# Patient Record
Sex: Female | Born: 1995
Health system: Southern US, Community
[De-identification: ages and names within clinical notes are randomized; demographics above are authoritative.]

## PROBLEM LIST (undated history)

## (undated) DIAGNOSIS — Z789 Other specified health status: Secondary | ICD-10-CM

## (undated) HISTORY — PX: NO PAST SURGERIES: SHX2092

## (undated) HISTORY — DX: Other specified health status: Z78.9

---
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
## 2018-07-07 ENCOUNTER — Emergency Department: Payer: BLUE CROSS/BLUE SHIELD

## 2018-07-07 ENCOUNTER — Encounter: Payer: Self-pay | Admitting: Emergency Medicine

## 2018-07-07 ENCOUNTER — Other Ambulatory Visit: Payer: Self-pay

## 2018-07-07 ENCOUNTER — Emergency Department
Admission: EM | Admit: 2018-07-07 | Discharge: 2018-07-07 | Disposition: A | Discharge status: Home / Self Care | Payer: BLUE CROSS/BLUE SHIELD | Attending: Emergency Medicine | Admitting: Emergency Medicine

## 2018-07-07 DIAGNOSIS — R0602 Shortness of breath: Secondary | ICD-10-CM | POA: Diagnosis not present

## 2018-07-07 DIAGNOSIS — J40 Bronchitis, not specified as acute or chronic: Secondary | ICD-10-CM | POA: Insufficient documentation

## 2018-07-07 DIAGNOSIS — R05 Cough: Secondary | ICD-10-CM | POA: Diagnosis not present

## 2018-07-07 DIAGNOSIS — R079 Chest pain, unspecified: Secondary | ICD-10-CM | POA: Diagnosis not present

## 2018-07-07 DIAGNOSIS — J0110 Acute frontal sinusitis, unspecified: Secondary | ICD-10-CM | POA: Insufficient documentation

## 2018-07-07 LAB — TROPONIN I
Troponin I: <0.03 ng/mL (ref <0.03)
Troponin I: <0.03 ng/mL (ref <0.03)

## 2018-07-07 LAB — CBC
HEMATOCRIT: 41.6 % (ref 36.0–46.0)
HEMOGLOBIN: 13.5 g/dL (ref 12.0–15.0)
MCH: 27.3 pg (ref 26.0–34.0)
MCHC: 32.5 g/dL (ref 30.0–36.0)
MCV: 84 fL (ref 80.0–100.0)
NRBC: 0 % (ref 0.0–0.2)
Platelets: 224 10*3/uL (ref 150–400)
RBC: 4.95 MIL/uL (ref 3.87–5.11)
RDW: 13.2 % (ref 11.5–15.5)
WBC: 9.9 10*3/uL (ref 4.0–10.5)

## 2018-07-07 LAB — BASIC METABOLIC PANEL
Anion gap: 9 (ref 5–15)
BUN: 11 mg/dL (ref 6–20)
CO2: 25 mmol/L (ref 22–32)
CREATININE: 0.9 mg/dL (ref 0.44–1.00)
Calcium: 9.1 mg/dL (ref 8.9–10.3)
Chloride: 107 mmol/L (ref 98–111)
GFR calc non Af Amer: >60 mL/min (ref >60)
Glucose, Bld: 102 mg/dL — ABNORMAL HIGH (ref 70–99)
POTASSIUM: 3.9 mmol/L (ref 3.5–5.1)
SODIUM: 141 mmol/L (ref 135–145)

## 2018-07-07 MED ORDER — PREDNISONE 20 MG PO TABS
40.0000 mg | ORAL_TABLET | Freq: Once | ORAL | Status: AC
  Administered 2018-07-07: 40 mg via ORAL
  Filled 2018-07-07: qty 2

## 2018-07-07 MED ORDER — PREDNISONE 20 MG PO TABS: 40.0000 mg | ORAL_TABLET | Freq: Every day | ORAL | 0 refills | Status: AC

## 2018-07-07 MED ORDER — IPRATROPIUM-ALBUTEROL 0.5-2.5 (3) MG/3ML IN SOLN
3.0000 mL | Freq: Once | RESPIRATORY_TRACT | Status: AC
  Administered 2018-07-07: 3 mL via RESPIRATORY_TRACT
  Filled 2018-07-07: qty 3

## 2018-07-07 MED ORDER — ALBUTEROL SULFATE HFA 108 (90 BASE) MCG/ACT IN AERS: 2.0000 | INHALATION_SPRAY | Freq: Four times a day (QID) | RESPIRATORY_TRACT | 2 refills | Status: DC | PRN

## 2018-07-07 MED ORDER — IBUPROFEN 400 MG PO TABS
400.0000 mg | ORAL_TABLET | Freq: Once | ORAL | Status: AC
  Administered 2018-07-07: 400 mg via ORAL
  Filled 2018-07-07: qty 1

## 2018-07-07 MED ORDER — OXYMETAZOLINE HCL 0.05 % NA SOLN
1.0000 | Freq: Once | NASAL | Status: AC
  Administered 2018-07-07: 1 via NASAL
  Filled 2018-07-07: qty 30

## 2018-07-07 NOTE — ED Provider Notes (Signed)
[facility] Emergency Department Provider Note  ____________________________________________  Time seen: Approximately 8:10 AM  I have reviewed the triage vital signs and the nursing notes.   HISTORY  Chief Complaint Cough and Facial Pain   HPI Michelle Tyler is a 23 y.o. female with no significant past medical history who presents for evaluation of chest pain.  Patient reports that she started with a sinus infection 2 days ago with congestion and postnasal drip and a mild cough.  This morning she woke up with pressure in her chest that is constant and nonradiating and moderate in intensity. Pain is reproducible with palpation of the anterior chest wall.  She is also feeling pressure in her upper back.  No fever or chills, no hemoptysis, no personal or family history of blood clots, recent travel immobilization, leg pain or swelling, exogenous hormones.  Patient has a history of asthma as a child however outgrew it.  She is not a smoker.  Denies any drug use.  No abdominal pain vomiting or diarrhea.  Past Medical History:  Diagnosis Date  . Medical history non-contributory     Patient Active Problem List   Diagnosis Date Noted  . Fibrocystic breast changes of both breasts 02/25/2016    Past Surgical History:  Procedure Laterality Date  . NO PAST SURGERIES      Prior to Admission medications   Medication Sig Start Date End Date Taking? Authorizing Provider  ibuprofen (ADVIL,MOTRIN) 200 MG tablet Take 200 mg by mouth every 6 (six) hours as needed.   Yes [provider]  albuterol (PROVENTIL HFA;VENTOLIN HFA) 108 (90 Base) MCG/ACT inhaler Inhale 2 puffs into the lungs every 6 (six) hours as needed for wheezing or shortness of breath. 07/07/18   Nita SickleVeronese, [place], MD  predniSONE (DELTASONE) 20 MG tablet Take 2 tablets (40 mg total) by mouth daily for 4 days. 07/07/18 07/11/18  Nita SickleVeronese, [place], MD    Allergies Penicillin g  Family History   Problem Relation Age of Onset  . Migraines Mother   . Thrombophlebitis Mother     Social History Social History   Tobacco Use  . Smoking status: Never Smoker  . Smokeless tobacco: Never Used  Substance Use Topics  . Alcohol use: No  . Drug use: No    Review of Systems  Constitutional: Negative for fever. Eyes: Negative for visual changes. ENT: Negative for sore throat. + sinus congestion Neck: No neck pain  Cardiovascular: + chest pain. Respiratory: + shortness of breath, cough Gastrointestinal: Negative for abdominal pain, vomiting or diarrhea. Genitourinary: Negative for dysuria. Musculoskeletal: Negative for back pain. Skin: Negative for rash. Neurological: Negative for headaches, weakness or numbness. Psych: No SI or HI  ____________________________________________   PHYSICAL EXAM:  VITAL SIGNS: ED Triage Vitals  Enc Vitals Group     BP 07/07/18 0751 129/80     Pulse Rate 07/07/18 0751 74     Resp 07/07/18 0751 16     Temp 07/07/18 0751 98.5 F (36.9 C)     Temp Source 07/07/18 0751 Oral     SpO2 07/07/18 0751 96 %     Weight 07/07/18 0752 160 lb (72.6 kg)     Height 07/07/18 0752 5\' 1"  (1.549 m)     Head Circumference --      Peak Flow --      Pain Score 07/07/18 0752 6     Pain Loc --      Pain Edu? --  Excl. in GC? --     Constitutional: Alert and oriented. Well appearing and in no apparent distress. HEENT:      Head: Normocephalic and atraumatic.         Eyes: Conjunctivae are normal. Sclera is non-icteric.       Mouth/Throat: Mucous membranes are moist.       Neck: Supple with no signs of meningismus. Cardiovascular: Regular rate and rhythm. No murmurs, gallops, or rubs. 2+ symmetrical distal pulses are present in all extremities. No JVD. Respiratory: Normal respiratory effort. Lungs are clear to auscultation bilaterally with slightly diminished air movement bilaterally. No wheezes, crackles, or rhonchi.  Gastrointestinal: Soft, non  tender, and non distended with positive bowel sounds. No rebound or guarding. Musculoskeletal: Nontender with normal range of motion in all extremities. No edema, cyanosis, or erythema of extremities. Neurologic: Normal speech and language. Face is symmetric. Moving all extremities. No gross focal neurologic deficits are appreciated. Skin: Skin is warm, dry and intact. No rash noted. Psychiatric: Mood and affect are normal. Speech and behavior are normal.  ____________________________________________   LABS (all labs ordered are listed, but only abnormal results are displayed)  Labs Reviewed  BASIC METABOLIC PANEL - Abnormal; Notable for the following components:      Result Value   Glucose, Bld 102 (*)    All other components within normal limits  CBC  TROPONIN I  TROPONIN I  URINALYSIS, COMPLETE (UACMP) WITH MICROSCOPIC  POC URINE PREG, ED   ____________________________________________  EKG  ED ECG REPORT I, Nita Sickle, the attending physician, personally viewed and interpreted this ECG.  Normal sinus rhythm, rate of 67, normal intervals, normal axis, no ST elevations or depressions.  Normal EKG. ____________________________________________  RADIOLOGY  I have personally reviewed the images performed during this visit and I agree with the Radiologist's read.   Interpretation by Radiologist:  Dg Chest 2 View  Result Date: 07/07/2018 CLINICAL DATA:  23 year old female with chest pressure, low back pain and cough EXAM: CHEST - 2 VIEW COMPARISON:  None. FINDINGS: The lungs are clear and negative for focal airspace consolidation, pulmonary edema or suspicious pulmonary nodule. No pleural effusion or pneumothorax. Cardiac and mediastinal contours are within normal limits. No acute fracture or lytic or blastic osseous lesions. The visualized upper abdominal bowel gas pattern is unremarkable. IMPRESSION: Normal chest x-ray. Electronically Signed   By: Malachy Moan M.D.    On: 07/07/2018 08:31      ____________________________________________   PROCEDURES  Procedure(s) performed: None Procedures Critical Care performed:  None ____________________________________________   INITIAL IMPRESSION / ASSESSMENT AND PLAN / ED COURSE  23 y.o. female with no significant past medical history who presents for evaluation of congestion, sinus pressure, cough, chest tightness and SOB.  Patient is extremely well-appearing and in no distress with normal vital signs, normal work of breathing, lungs are clear to auscultation with slightly diminished air movement bilaterally but no wheezing or crackles.  EKG with no evidence of ischemia or dysrhythmias.  Chest x-ray with no evidence of pneumonia, pneumothorax, pulmonary edema.  Presentation concerning for viral process vs bronchitis.  With a history of childhood asthma patient could have some bronchospasm causing her symptoms. Will give duoneb. Also possible pleurisy will give ibuprofen. PERC negative for PE. Heart score 0.    _________________________ 1:12 PM on 07/07/2018 -----------------------------------------  Troponin x2-.  Patient remains extremely well-appearing.  Report significant improvement of her symptoms after 1 DuoNeb and ibuprofen.  Will discharge home on prednisone and albuterol  for possible bronchitis.  Recommended follow-up with primary care doctor.  Discussed return precautions.   As part of my medical decision making, I reviewed the following data within the electronic MEDICAL RECORD NUMBER Nursing notes reviewed and incorporated, Labs reviewed , EKG interpreted , Old EKG reviewed, Old chart reviewed, Radiograph reviewed , Notes from prior ED visits and [place] Controlled Substance Database    Pertinent labs & imaging results that were available during my care of the patient were reviewed by me and considered in my medical decision making (see chart for details).    ____________________________________________    FINAL CLINICAL IMPRESSION(S) / ED DIAGNOSES  Final diagnoses:  Acute frontal sinusitis, recurrence not specified  Bronchitis      NEW MEDICATIONS STARTED DURING THIS VISIT:  ED Discharge Orders         Ordered    albuterol (PROVENTIL HFA;VENTOLIN HFA) 108 (90 Base) MCG/ACT inhaler  Every 6 hours PRN     07/07/18 1311    predniSONE (DELTASONE) 20 MG tablet  Daily     07/07/18 1311           Note:  This document was prepared using Dragon voice recognition software and may include unintentional dictation errors.    Don Perking, [place], MD 07/07/18 [phone]

## 2018-07-07 NOTE — ED Notes (Signed)
Patient reports improvement of chest tightness after completion of breathing treatment. States as long as she doesn't lay down, she feels ok.

## 2018-07-07 NOTE — ED Triage Notes (Signed)
Sinus problems yesterday and today woke with pain in chest like a brick on it.  Back aches.

## 2018-10-05 ENCOUNTER — Emergency Department
Admission: EM | Admit: 2018-10-05 | Discharge: 2018-10-05 | Disposition: A | Discharge status: Home / Self Care | Payer: BC Managed Care – PPO | Attending: Emergency Medicine | Admitting: Emergency Medicine

## 2018-10-05 ENCOUNTER — Other Ambulatory Visit: Payer: Self-pay

## 2018-10-05 ENCOUNTER — Emergency Department: Payer: BC Managed Care – PPO

## 2018-10-05 DIAGNOSIS — Y998 Other external cause status: Secondary | ICD-10-CM | POA: Diagnosis not present

## 2018-10-05 DIAGNOSIS — Y93I9 Activity, other involving external motion: Secondary | ICD-10-CM | POA: Diagnosis not present

## 2018-10-05 DIAGNOSIS — R51 Headache: Secondary | ICD-10-CM | POA: Insufficient documentation

## 2018-10-05 DIAGNOSIS — R42 Dizziness and giddiness: Secondary | ICD-10-CM | POA: Diagnosis present

## 2018-10-05 DIAGNOSIS — S199XXA Unspecified injury of neck, initial encounter: Secondary | ICD-10-CM | POA: Diagnosis not present

## 2018-10-05 DIAGNOSIS — Y92410 Unspecified street and highway as the place of occurrence of the external cause: Secondary | ICD-10-CM | POA: Diagnosis not present

## 2018-10-05 DIAGNOSIS — M542 Cervicalgia: Secondary | ICD-10-CM | POA: Diagnosis not present

## 2018-10-05 MED ORDER — ACETAMINOPHEN 500 MG PO TABS
1000.0000 mg | ORAL_TABLET | Freq: Once | ORAL | Status: AC
  Administered 2018-10-05: 1000 mg via ORAL
  Filled 2018-10-05: qty 2

## 2018-10-05 MED ORDER — MELOXICAM 15 MG PO TABS: 15.0000 mg | ORAL_TABLET | Freq: Every day | ORAL | 1 refills | Status: AC

## 2018-10-05 MED ORDER — CYCLOBENZAPRINE HCL 5 MG PO TABS: 5.0000 mg | ORAL_TABLET | Freq: Three times a day (TID) | ORAL | 0 refills | Status: AC | PRN
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
## 2018-10-23 ENCOUNTER — Other Ambulatory Visit: Payer: BLUE CROSS/BLUE SHIELD

## 2018-10-23 ENCOUNTER — Telehealth: Payer: Self-pay | Admitting: *Deleted

## 2018-10-23 DIAGNOSIS — Z20822 Contact with and (suspected) exposure to covid-19: Secondary | ICD-10-CM

## 2018-10-23 NOTE — Telephone Encounter (Signed)
Referred by [facility] Dept due to sxs. Appointment for today at [place] site for 3:45p. Informed to wear mask and stay in vehicle.

## 2018-10-30 LAB — NOVEL CORONAVIRUS, NAA: SARS-CoV-2, NAA: NOT DETECTED

## 2019-01-06 HISTORY — PX: MOUTH SURGERY: SHX715

## 2019-01-09 ENCOUNTER — Other Ambulatory Visit: Payer: Self-pay

## 2019-01-09 ENCOUNTER — Ambulatory Visit (INDEPENDENT_AMBULATORY_CARE_PROVIDER_SITE_OTHER): Payer: BC Managed Care – PPO | Admitting: Obstetrics and Gynecology

## 2019-01-09 ENCOUNTER — Encounter: Payer: Self-pay | Admitting: Obstetrics and Gynecology

## 2019-01-09 VITALS — BP 110/78 | HR 72 | Ht 61.0 in | Wt 181.8 lb

## 2019-01-09 DIAGNOSIS — N6011 Diffuse cystic mastopathy of right breast: Secondary | ICD-10-CM | POA: Diagnosis not present

## 2019-01-09 NOTE — Patient Instructions (Signed)
Fibrocystic Breast Changes  Fibrocystic breast changes are changes in breast tissue that can cause breasts to become swollen, lumpy, or painful. This can happen due to buildup of scar-like tissue (fibrous tissue) or the forming of fluid-filled lumps (cysts) in the breast. This is a common condition, and it is not cancerous (is benign). The exact cause is not known, but it seems to occur when women go through hormonal changes during their menstrual cycle. Fibrocystic breast changes can affect one or both breasts. What are the causes? The exact cause of fibrocystic breast changes is not known. However, this condition:  May be related to the female hormones estrogen and progesterone.  May be influenced by family traits that get passed from parent to child (genetics). What are the signs or symptoms? Symptoms of this condition may affect one or both breasts, and may include:  Tenderness, mild discomfort, or pain.  Swelling.  Rope-like tissue that can be felt when touching the breast.  Lumps in one or both breasts.  Changes in breast size. Breasts may get larger before the menstrual period and smaller after the menstrual period.  Green or dark brown discharge from the nipple. Symptoms are usually worse before menstrual periods start, and they get better toward the end of menstrual periods. How is this diagnosed? This condition is diagnosed based on your medical history and a physical exam of your breasts. You may also have tests, such as:  A breast X-ray (mammogram).  Ultrasound of your breasts.  MRI.  Removal of a breast tissue sample for testing (breast biopsy). This may be done if your health care provider thinks that something else may be causing changes in your breasts. How is this treated? Often, treatment is not needed for this condition. In some cases, treatment may include:  Taking over-the-counter pain relievers to help lessen pain or discomfort.  Limiting or avoiding  caffeine. Foods and beverages that contain caffeine include chocolate, soda, coffee, and tea.  Reducing sugar and fat in your diet. Your health care provider may also recommend:  A procedure to remove fluid from a cyst that is causing pain (fine needle aspiration).  Surgery to remove a cyst that is large or tender or does not go away. Follow these instructions at home:  Examine your breasts after every menstrual period. If you do not have menstrual periods, check your breasts on the first day of every month. Feel for changes in your breasts, such as: ? More tenderness. ? A new growth. ? A change in size. ? A change in an existing lump.  Take over-the-counter and prescription medicines only as told by your health care provider.  Wear a well-fitted support or sports bra, especially when exercising.  Decrease or avoid caffeine, fat, and sugar in your diet as directed by your health care provider. Contact a health care provider if:  You have fluid leaking from your nipple, especially if it is bloody.  You have new lumps or bumps in your breast.  Your breast becomes enlarged, red, and painful.  You have areas of your breast that pucker inward.  Your nipple appears flat or indented. Get help right away if:  You have redness of your breast and the redness is spreading. Summary  Fibrocystic breast changes are changes in breast tissue that can cause breasts to become swollen, lumpy, or painful.  This condition may be related to the female hormones estrogen and progesterone.  With this condition, it is important to examine your breasts after every   menstrual period. If you do not have menstrual periods, check your breasts on the first day of every month. This information is not intended to replace advice given to you by your health care provider. Make sure you discuss any questions you have with your health care provider. Document Released: 01/24/2006 Document Revised: 03/22/2017  Document Reviewed: 12/07/2015 Elsevier Patient Education  2020 Elsevier Inc.  

## 2019-01-09 NOTE — Progress Notes (Signed)
   GYNECOLOGY CLINIC PROGRESS NOTE  Subjective:     Michelle Tyler is an 23 y.o. female who presents for evaluation of a breast mass near right axillary region, with associated random episodes of stinging pains. Change was noted 3 weeks ago, and has been stable since first identified. Patient does not routinely do self breast exams. The mass is tender to deep palpation (but not painful with shallow). Patient denies nipple discharge. Breast cancer risk factors include: none.  Patient of note does have a history of fibrocystic breasts. Has been off contraception for ~ 2 years.   The following portions of the patient's history were reviewed and updated as appropriate:  She  has a past medical history of Medical history non-contributory. She  has a past surgical history that includes No past surgeries and Mouth surgery (01/06/2019). Her family history includes Migraines in her mother; Thrombophlebitis in her mother. She  reports that she has never smoked. She has never used smokeless tobacco. She reports that she does not drink alcohol or use drugs. She has a current medication list which includes the following prescription(s): albuterol, azithromycin, and ibuprofen. She is allergic to penicillin g..   Review of Systems A comprehensive review of systems was negative except for: Integument/breast: positive for breast lump  and tenderness   Objective:    BP 110/78   Pulse 72   Ht 5\' 1"  (1.549 m)   Wt 181 lb 12.8 oz (82.5 kg)   LMP 12/24/2018   BMI 34.35 kg/m  General appearance: alert and no distress Breasts:  Breasts: Left breast normal without mass, skin or nipple changes or axillary nodes.  Right breast with nodular fibrous changes in upper outer quadrant, no skin or nipple changes or axillary nodes.   Assessment:    mastalgia, likely due to fibrocystic changes of right breast   Plan:    Discussed fibrocystic disease and alleviating measures. Reassured the patient that the finding  is most likely benign. Follow up in 3 months should symptoms not improve. Also due for annual exam, can perform at that time.    Rubie Maid, MD [facility]

## 2019-01-09 NOTE — Progress Notes (Signed)
Pt present stating having a mass on her right breast with pressure, pain and stinging x 3 weeks.

## 2019-03-16 ENCOUNTER — Other Ambulatory Visit: Payer: Self-pay

## 2019-03-16 DIAGNOSIS — Z20822 Contact with and (suspected) exposure to covid-19: Secondary | ICD-10-CM

## 2019-03-18 LAB — NOVEL CORONAVIRUS, NAA: SARS-CoV-2, NAA: DETECTED — AB

## 2019-03-22 ENCOUNTER — Telehealth: Payer: Self-pay

## 2019-03-22 ENCOUNTER — Ambulatory Visit: Payer: Self-pay
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
## 2019-03-22 NOTE — Telephone Encounter (Signed)
Please see phone note  

## 2019-04-28 ENCOUNTER — Encounter: Payer: BC Managed Care – PPO | Admitting: Obstetrics and Gynecology

## 2020-11-14 ENCOUNTER — Other Ambulatory Visit: Payer: Self-pay

## 2020-11-14 ENCOUNTER — Ambulatory Visit (INDEPENDENT_AMBULATORY_CARE_PROVIDER_SITE_OTHER): Payer: BC Managed Care – PPO | Admitting: Internal Medicine

## 2020-11-14 ENCOUNTER — Encounter: Payer: Self-pay | Admitting: Internal Medicine

## 2020-11-14 VITALS — BP 131/87 | Ht 61.0 in | Wt 166.3 lb

## 2020-11-14 DIAGNOSIS — Z23 Encounter for immunization: Secondary | ICD-10-CM | POA: Insufficient documentation

## 2020-11-14 DIAGNOSIS — J4520 Mild intermittent asthma, uncomplicated: Secondary | ICD-10-CM

## 2020-11-14 DIAGNOSIS — F419 Anxiety disorder, unspecified: Secondary | ICD-10-CM | POA: Insufficient documentation
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
## 2020-11-14 NOTE — Assessment & Plan Note (Signed)
Patient was given a tetanus shot, she was also suggested to get a HPV vaccine

## 2020-11-14 NOTE — Assessment & Plan Note (Signed)
Asthma is under control at the present time 

## 2020-11-14 NOTE — Assessment & Plan Note (Signed)
-   Patient experiencing high levels of anxiety.  - Encouraged patient to engage in relaxing activities like yoga, meditation, journaling, going for a walk, or participating in a hobby.  - Encouraged patient to reach out to trusted friends or family members about recent struggles, Patient was advised to read A book, how to stop worrying and start living, it is good book to read to control  the stress  

## 2020-11-14 NOTE — Progress Notes (Signed)
New Patient Office Visit  Subjective:  Patient ID: Michelle Tyler, female    DOB: [birth year]  Age: 25 y.o. MRN: [id number]  CC:  Chief Complaint  Patient presents with   New Patient (Initial Visit)    HPI Patient presents for establish care  Past Medical History:  Diagnosis Date   Medical history non-contributory      Current Outpatient Medications:    sertraline (ZOLOFT) 50 MG tablet, Take 50 mg by mouth daily., Disp: , Rfl:    Past Surgical History:  Procedure Laterality Date   MOUTH SURGERY  01/06/2019   NO PAST SURGERIES      Family History  Problem Relation Age of Onset   Migraines Mother    Thrombophlebitis Mother     Social History   Socioeconomic History   Marital status: Single    Spouse name: Not on file   Number of children: Not on file   Years of education: Not on file   Highest education level: Not on file  Occupational History   Not on file  Tobacco Use   Smoking status: Never   Smokeless tobacco: Never  Vaping Use   Vaping Use: Never used  Substance and Sexual Activity   Alcohol use: No   Drug use: No   Sexual activity: Yes    Birth control/protection: Condom  Other Topics Concern   Not on file  Social History Narrative   Not on file   Social Determinants of Health   Financial Resource Strain: Not on file  Food Insecurity: Not on file  Transportation Needs: Not on file  Physical Activity: Not on file  Stress: Not on file  Social Connections: Not on file  Intimate Partner Violence: Not on file    ROS Review of Systems  Constitutional: Negative.   HENT: Negative.    Eyes: Negative.   Respiratory: Negative.    Cardiovascular: Negative.   Gastrointestinal: Negative.  Negative for blood in stool and constipation.  Endocrine: Negative.   Genitourinary: Negative.  Negative for hematuria, menstrual problem, pelvic pain and vaginal bleeding.  Musculoskeletal: Negative.  Negative for gait problem.  Skin: Negative.    Allergic/Immunologic: Negative.   Neurological: Negative.   Hematological: Negative.   Psychiatric/Behavioral:  Positive for dysphoric mood. Negative for behavioral problems, confusion, decreased concentration, hallucinations, self-injury, sleep disturbance and suicidal ideas. The patient is nervous/anxious. The patient is not hyperactive.   All other systems reviewed and are negative.  Objective:   Today's Vitals: BP 131/87   Ht 5\' 1"  (1.549 m)   Wt 166 lb 4.8 oz (75.4 kg)   BMI 31.42 kg/m   Physical Exam Constitutional:      Appearance: Normal appearance. She is normal weight.  HENT:     Head: Normocephalic.     Right Ear: Tympanic membrane normal.     Left Ear: Tympanic membrane normal.     Nose: Nose normal.     Mouth/Throat:     Mouth: Mucous membranes are moist.  Eyes:     Conjunctiva/sclera: Conjunctivae normal.     Pupils: Pupils are equal, round, and reactive to light.  Cardiovascular:     Rate and Rhythm: Normal rate.  Pulmonary:     Effort: Pulmonary effort is normal.     Breath sounds: No wheezing or rhonchi.  Skin:    Coloration: Skin is not jaundiced.  Neurological:     General: No focal deficit present.     Mental Status: She is alert and  oriented to person, place, and time.  Psychiatric:        Mood and Affect: Mood normal.    Assessment & Plan:   Problem List Items Addressed This Visit       Respiratory   Mild intermittent asthma without complication    Asthma is under control at the present time         Other   Anxiety    - Patient experiencing high levels of anxiety.  - Encouraged patient to engage in relaxing activities like yoga, meditation, journaling, going for a walk, or participating in a hobby.  - Encouraged patient to reach out to trusted friends or family members about recent struggles, Patient was advised to read A book, how to stop worrying and start living, it is good book to read to control  the stress        Relevant  Medications   sertraline (ZOLOFT) 50 MG tablet   Need for tetanus, diphtheria, and acellular pertussis (Tdap) vaccine - Primary    Patient was given a tetanus shot, she was also suggested to get a HPV vaccine       Relevant Orders   Tdap vaccine greater than or equal to 7yo IM (Completed)    Outpatient Encounter Medications as of 11/14/2020  Medication Sig   sertraline (ZOLOFT) 50 MG tablet Take 50 mg by mouth daily.   [DISCONTINUED] albuterol (PROVENTIL HFA;VENTOLIN HFA) 108 (90 Base) MCG/ACT inhaler Inhale 2 puffs into the lungs every 6 (six) hours as needed for wheezing or shortness of breath.   [DISCONTINUED] azithromycin (ZITHROMAX) 250 MG tablet Take by mouth daily.   [DISCONTINUED] ibuprofen (ADVIL,MOTRIN) 200 MG tablet Take 200 mg by mouth every 6 (six) hours as needed.   No facility-administered encounter medications on file as of 11/14/2020.    Follow-up: No follow-ups on file.   Corky Downs, MD

## 2021-09-19 ENCOUNTER — Ambulatory Visit (LOCAL_COMMUNITY_HEALTH_CENTER): Payer: BC Managed Care – PPO | Admitting: Family Medicine

## 2021-09-19 ENCOUNTER — Encounter: Payer: Self-pay | Admitting: Family Medicine

## 2021-09-19 VITALS — BP 116/82 | Ht 60.0 in | Wt 173.6 lb

## 2021-09-19 DIAGNOSIS — Z3009 Encounter for other general counseling and advice on contraception: Secondary | ICD-10-CM

## 2021-09-19 DIAGNOSIS — Z30011 Encounter for initial prescription of contraceptive pills: Secondary | ICD-10-CM | POA: Diagnosis not present

## 2021-09-19 LAB — WET PREP FOR TRICH, YEAST, CLUE
Trichomonas Exam: NEGATIVE
Yeast Exam: NEGATIVE

## 2021-09-19 MED ORDER — CRYSELLE-28 0.3-30 MG-MCG PO TABS: 1.0000 | ORAL_TABLET | Freq: Every day | ORAL | 4 refills | Status: AC

## 2021-09-19 NOTE — Addendum Note (Signed)
Addended by: Larene Pickett on: 09/19/2021 03:02 PM   Modules accepted: Orders

## 2021-09-19 NOTE — Progress Notes (Signed)
Pt here for PE, Pap and BC.  Wet mount results reviewed, no treatment required.  Berdie Ogren, RN

## 2021-09-19 NOTE — Progress Notes (Signed)
[facility] DEPARTMENT [facility] [address] Main Number: [phone]    Family Planning Visit- Initial Visit  Subjective:  Michelle Tyler is a 26 y.o.  [id number]   being seen today for an initial annual visit and to discuss reproductive life planning.  The patient is currently using Oral Contraceptive for pregnancy prevention. Patient reports   does not want a pregnancy in the next year.     report they are looking for a method that provides Cycle control and High efficacy at preventing pregnancy  Patient has the following medical conditions has Fibrocystic breast changes of both breasts; Anxiety; Mild intermittent asthma without complication; and Need for tetanus, diphtheria, and acellular pertussis (Tdap) vaccine on their problem list.  Chief Complaint  Patient presents with   Annual Exam    Patient reports she is here for her annual exam and pap.  States that she received her OCPs from student health at [employer].  She doesn't remember the name of the pill.  She would like to continue with OCPs.    Patient denies concerns.  Body mass index is 33.9 kg/m. - Patient is eligible for diabetes screening based on BMI and age >57?  not applicable HA1C ordered? not applicable  Patient reports 1  partner/s in last year. Desires STI screening?  Yes  Has patient been screened once for HCV in the past?  No  No results found for: HCVAB  Does the patient have current drug use (including MJ), have a partner with drug use, and/or has been incarcerated since last result? No  If yes-- Screen for HCV through [facility]   Does the patient meet criteria for HBV testing? No  Criteria:  -Household, sexual or needle sharing contact with HBV -History of drug use -HIV positive -Those with known Hep C   Health Maintenance Due  Topic Date Due   COVID-19 Vaccine (1) Never done   HPV VACCINES (1 - 2-dose series) Never done   HIV Screening  Never done    Hepatitis C Screening  Never done   PAP-Cervical Cytology Screening  Never done   PAP SMEAR-Modifier  Never done    Review of Systems  All other systems reviewed and are negative.  The following portions of the patient's history were reviewed and updated as appropriate: allergies, current medications, past family history, past medical history, past social history, past surgical history and problem list. Problem list updated.   See flowsheet for other program required questions.  Objective:   Vitals:   09/19/21 1018  BP: 116/82  Weight: 173 lb 9.6 oz (78.7 kg)  Height: 5' (1.524 m)    Physical Exam Constitutional:      Appearance: Normal appearance. She is obese.  HENT:     Head: Normocephalic and atraumatic.  Pulmonary:     Effort: Pulmonary effort is normal.  Chest:  Breasts:    Right: Normal.     Left: Normal.  Abdominal:     Palpations: Abdomen is soft.  Genitourinary:    General: Normal vulva.     Pubic Area: No rash or pubic lice.      Labia:        Right: No rash, tenderness, lesion or injury.        Left: No rash, tenderness, lesion or injury.      Urethra: No urethral lesion.     Cervix: Normal.     Uterus: Normal.      Adnexa:  Right adnexa normal and left adnexa normal.     Rectum: Normal.  Musculoskeletal:        General: Normal range of motion.     Cervical back: Neck supple.  Lymphadenopathy:     Upper Body:     Right upper body: No supraclavicular or axillary adenopathy.     Left upper body: No supraclavicular or axillary adenopathy.     Lower Body: No right inguinal adenopathy. No left inguinal adenopathy.  Skin:    General: Skin is warm and dry.  Neurological:     General: No focal deficit present.     Mental Status: She is alert.  Psychiatric:        Mood and Affect: Mood normal.        Behavior: Behavior normal.      Assessment and Plan:  Michelle Tyler is a 26 y.o. female presenting to the [facility] Department for an  initial annual wellness/contraceptive visit  Contraception counseling: Reviewed options based on patient desire and reproductive life plan. Patient is interested in Oral Contraceptive. This was provided to the patient today.  Risks, benefits, and typical effectiveness rates were reviewed.  Questions were answered.  Written information was also given to the patient to review.    The patient will follow up in  1 years for surveillance.  The patient was told to call with any further questions, or with any concerns about this method of contraception.  Emphasized use of condoms 100% of the time for STI prevention.  Need for ECP was assessed  Client isn't an ECP candidate based on the criteria.  1. Family planning  - Pap IG (Image Guided) - Chlamydia/Gonorrhea [facility] - WET PREP FOR TRICH, YEAST, CLUE - norgestrel-ethinyl estradiol (CRYSELLE-28) 0.3-30 MG-MCG tablet; Take 1 tablet by mouth daily.  Dispense: 90 tablet; Refill: 4  2. OCP (oral contraceptive pills) initiation  Client is interested in the extended cycle for OCPs - norgestrel-ethinyl estradiol (CRYSELLE-28) 0.3-30 MG-MCG tablet; Take 1 tablet by mouth daily.  Dispense: 90 tablet; Refill: 4  Co to use condoms for STD prevention.   No follow-ups on file.  No future appointments.  Larene Pickett, FNP

## 2021-09-22 LAB — PAP IG (IMAGE GUIDED): PAP Smear Comment: 0

## 2021-10-01 ENCOUNTER — Encounter: Payer: Self-pay | Admitting: Emergency Medicine

## 2021-10-01 ENCOUNTER — Ambulatory Visit
Admission: EM | Admit: 2021-10-01 | Discharge: 2021-10-01 | Disposition: A | Discharge status: Home / Self Care | Payer: BC Managed Care – PPO | Attending: Emergency Medicine | Admitting: Emergency Medicine

## 2021-10-01 DIAGNOSIS — Z20818 Contact with and (suspected) exposure to other bacterial communicable diseases: Secondary | ICD-10-CM | POA: Diagnosis not present

## 2021-10-01 DIAGNOSIS — J029 Acute pharyngitis, unspecified: Secondary | ICD-10-CM | POA: Insufficient documentation

## 2021-10-01 LAB — POCT RAPID STREP A (OFFICE): Rapid Strep A Screen: NEGATIVE

## 2021-10-01 MED ORDER — IBUPROFEN 800 MG PO TABS: 800.0000 mg | ORAL_TABLET | Freq: Three times a day (TID) | ORAL | 0 refills | Status: AC

## 2021-10-01 MED ORDER — AZITHROMYCIN 250 MG PO TABS: 250.0000 mg | ORAL_TABLET | Freq: Every day | ORAL | 0 refills | Status: AC

## 2021-10-01 NOTE — ED Triage Notes (Signed)
Pt reports sore throat since last Thursday, and fever lastnight. States the pain has gotten progressively worse.

## 2021-10-01 NOTE — ED Provider Notes (Signed)
  [facility]   MRN: [id number] DOB: 01-14-1996  Subjective:   Chief Complaint;  Chief Complaint  Patient presents with   Sore Throat    Entered by patient   Fever  Pt reports sore throat since last Thursday, and fever lastnight. States the pain has gotten progressively worse  ROSSETTA KAMA is a 26 y.o. female presenting for sore throat and fever for the last couple of days.  Positive recent exposure to strep throat friend  No current facility-administered medications for this encounter.  Current Outpatient Medications:    azithromycin (ZITHROMAX Z-PAK) 250 MG tablet, Take 1 tablet (250 mg total) by mouth daily for 6 doses. Double dose on day one for total of 5 day treatment, Disp: 6 tablet, Rfl: 0   ibuprofen (ADVIL) 800 MG tablet, Take 1 tablet (800 mg total) by mouth 3 (three) times daily., Disp: 21 tablet, Rfl: 0   norgestrel-ethinyl estradiol (CRYSELLE-28) 0.3-30 MG-MCG tablet, Take 1 tablet by mouth daily., Disp: 90 tablet, Rfl: 4   sertraline (ZOLOFT) 50 MG tablet, Take 50 mg by mouth daily. (Patient not taking: Reported on 09/19/2021), Disp: , Rfl:    Allergies  Allergen Reactions   Penicillin G Nausea And Vomiting    Past Medical History:  Diagnosis Date   Medical history non-contributory      ROS   Objective:   Vitals: BP (!) 139/98 (BP Location: Left Arm)   Pulse 87   Temp 99 F (37.2 C) (Oral)   Resp 18   LMP 08/15/2021 (Exact Date)   SpO2 98%   Physical Exam  Results for orders placed or performed during the hospital encounter of 10/01/21 (from the past 24 hour(s))  POCT rapid strep A     Status: None   Collection Time: 10/01/21 12:49 PM  Result Value Ref Range   Rapid Strep A Screen Negative Negative    No results found.     Assessment and Plan :   1. Acute pharyngitis, unspecified etiology   2. Exposure to strep throat     Meds ordered this encounter  Medications   azithromycin (ZITHROMAX Z-PAK) 250 MG tablet     Sig: Take 1 tablet (250 mg total) by mouth daily for 6 doses. Double dose on day one for total of 5 day treatment    Dispense:  6 tablet    Refill:  0   ibuprofen (ADVIL) 800 MG tablet    Sig: Take 1 tablet (800 mg total) by mouth 3 (three) times daily.    Dispense:  21 tablet    Refill:  0    MDM:  CRYSTOL WALPOLE is a 26 y.o. female presenting for sore throat with intermittent fevers for the last 2 days.  Patient also had exposure to strep from a friend who was recently diagnosed last week her exam is consistent with a strep pharyngitis.  She is allergic to the penicillin so I started Z-Pak along with Motrin and Tylenol.  I discussed todays findings, treatment plan, follow up and return instructions. Questions were answered. Patient/representative stated understanding of the instructions and patient is stable for discharge.  Dewaine Conger FNP-C MSN    Jone Baseman, NP 10/01/21 1257

## 2021-10-01 NOTE — Discharge Instructions (Addendum)
Take Motrin and Tylenol for pain.  Take antibiotic Z-Pak.  Your rapid strep today was negative the culture is pending however given you have significant clinical signs of strep we are starting antibiotic therapy.  Return if new or worsening symptoms for reevaluation

## 2021-10-02 LAB — CULTURE, GROUP A STREP (THRC)

## 2021-10-09 NOTE — Progress Notes (Unsigned)
Late Entry Pap Triage  NIL  Next pap in 3 years 

## 2021-10-10 NOTE — Progress Notes (Unsigned)
PAP Normal and HPV not done.  Repeat PAP in 3 years (08-2024) per Lyndel Safe, MD.  MyChart message sent to patient today regarding her PAP results.  Last patient login 10-04-2021.  Routed 10-09-2021.  Hart Carwin, RN
